# Patient Record
Sex: Female | Born: 1935 | Race: Black or African American | Hispanic: No | Smoking: Never smoker
Health system: Southern US, Community
[De-identification: ages and names within clinical notes are randomized; demographics above are authoritative.]

## PROBLEM LIST (undated history)

## (undated) DIAGNOSIS — I10 Essential (primary) hypertension: Secondary | ICD-10-CM

## (undated) HISTORY — PX: JOINT REPLACEMENT: SHX530

---

## 2020-10-05 ENCOUNTER — Emergency Department (HOSPITAL_BASED_OUTPATIENT_CLINIC_OR_DEPARTMENT_OTHER)
Admission: EM | Admit: 2020-10-05 | Discharge: 2020-10-05 | Disposition: A | Discharge status: Home / Self Care | Payer: Medicare Other | Attending: Emergency Medicine | Admitting: Emergency Medicine

## 2020-10-05 ENCOUNTER — Emergency Department (HOSPITAL_BASED_OUTPATIENT_CLINIC_OR_DEPARTMENT_OTHER): Payer: Medicare Other

## 2020-10-05 ENCOUNTER — Other Ambulatory Visit: Payer: Self-pay

## 2020-10-05 ENCOUNTER — Encounter (HOSPITAL_BASED_OUTPATIENT_CLINIC_OR_DEPARTMENT_OTHER): Payer: Self-pay | Admitting: Emergency Medicine

## 2020-10-05 DIAGNOSIS — S39012A Strain of muscle, fascia and tendon of lower back, initial encounter: Secondary | ICD-10-CM | POA: Insufficient documentation

## 2020-10-05 DIAGNOSIS — R519 Headache, unspecified: Secondary | ICD-10-CM | POA: Diagnosis not present

## 2020-10-05 DIAGNOSIS — Z79899 Other long term (current) drug therapy: Secondary | ICD-10-CM | POA: Diagnosis not present

## 2020-10-05 DIAGNOSIS — I10 Essential (primary) hypertension: Secondary | ICD-10-CM | POA: Diagnosis not present

## 2020-10-05 DIAGNOSIS — W101XXA Fall (on)(from) sidewalk curb, initial encounter: Secondary | ICD-10-CM | POA: Diagnosis not present

## 2020-10-05 DIAGNOSIS — S34109A Unspecified injury to unspecified level of lumbar spinal cord, initial encounter: Secondary | ICD-10-CM | POA: Diagnosis present

## 2020-10-05 DIAGNOSIS — M62830 Muscle spasm of back: Secondary | ICD-10-CM

## 2020-10-05 DIAGNOSIS — M545 Low back pain, unspecified: Secondary | ICD-10-CM

## 2020-10-05 HISTORY — DX: Essential (primary) hypertension: I10

## 2020-10-05 MED ORDER — LIDOCAINE 5 % EX PTCH: 1.0000 | MEDICATED_PATCH | CUTANEOUS | 0 refills | Status: AC

## 2020-10-05 MED ORDER — MORPHINE SULFATE (PF) 4 MG/ML IV SOLN
4.0000 mg | Freq: Once | INTRAVENOUS | Status: AC
  Administered 2020-10-05: 4 mg via INTRAVENOUS
  Filled 2020-10-05: qty 1

## 2020-10-05 MED ORDER — CYCLOBENZAPRINE HCL 5 MG PO TABS: 5.0000 mg | ORAL_TABLET | Freq: Two times a day (BID) | ORAL | 0 refills | Status: AC | PRN

## 2020-10-05 NOTE — ED Notes (Signed)
Pt fell getting into car. Complaining of lower back pain and pain in her tailbone.

## 2020-10-05 NOTE — ED Triage Notes (Signed)
Pt arrived via EMS after a fall complaining of tailbone and back pain. No deformities per EMS. Pt fell backwards getting out of car. Alert and oriented no loss of cons.

## 2020-10-05 NOTE — ED Notes (Signed)
Medicated per EDP orders, pt instructed not to get off stretcher without assistance in room, sr x 2 up, call bell within easy reach of client, cont to be on cont POX monitoring with int NBP assessments

## 2020-10-05 NOTE — ED Provider Notes (Signed)
[facility] EMERGENCY DEPARTMENT Provider Note   CSN: [id number] Arrival date & time: 10/05/20  1843     History Chief Complaint  Patient presents with  . Back Pain  . Rectal Pain    Alyssa Ellis is a 87 y.o. female.  The history is provided by the patient and medical records. No language interpreter was used.  Fall This is a new problem. The current episode started 3 to 5 hours ago. The problem occurs rarely. The problem has not changed since onset.Associated symptoms include headaches. Pertinent negatives include no chest pain, no abdominal pain and no shortness of breath. Nothing aggravates the symptoms. Nothing relieves the symptoms. She has tried nothing for the symptoms. The treatment provided no relief.       Past Medical History:  Diagnosis Date  . Hypertension     There are no problems to display for this patient.    OB History   No obstetric history on file.     No family history on file.  Social History   Tobacco Use  . Smoking status: Never Smoker  Vaping Use  . Vaping Use: Never used  Substance Use Topics  . Alcohol use: Never  . Drug use: Never    Home Medications Prior to Admission medications   Medication Sig Start Date End Date Taking? Authorizing Provider  DULoxetine (CYMBALTA) 30 MG capsule Take 30 mg by mouth daily.   Yes [provider]  metoprolol succinate (TOPROL-XL) 50 MG 24 hr tablet Take 50 mg by mouth daily. Take with or immediately following a meal.   Yes [provider]    Allergies    Eggs or egg-derived products  Review of Systems   Review of Systems  Constitutional: Negative for chills, fatigue and fever.  HENT: Negative for congestion.   Eyes: Negative for photophobia and visual disturbance.  Respiratory: Negative for cough, chest tightness and shortness of breath.   Cardiovascular: Negative for chest pain.  Gastrointestinal: Negative for abdominal pain, constipation, diarrhea, nausea and  vomiting.  Genitourinary: Negative for dysuria and frequency.  Musculoskeletal: Positive for back pain and neck pain. Negative for gait problem and neck stiffness.  Neurological: Positive for headaches. Negative for dizziness and weakness.  Psychiatric/Behavioral: Negative for agitation and confusion.  All other systems reviewed and are negative.   Physical Exam Updated Vital Signs BP (!) 178/67 (BP Location: Right Arm)   Pulse (!) 57   Temp 98.7 F (37.1 C) (Oral)   Resp 16   Ht 5\' 4"  (1.626 m)   Wt 80.7 kg   SpO2 98%   BMI 30.55 kg/m   Physical Exam Vitals and nursing note reviewed.  Constitutional:      General: She is not in acute distress.    Appearance: She is well-developed and well-nourished. She is not ill-appearing, toxic-appearing or diaphoretic.  HENT:     Head: Normocephalic and atraumatic.     Nose: No congestion or rhinorrhea.     Mouth/Throat:     Mouth: Mucous membranes are moist.     Pharynx: No oropharyngeal exudate or posterior oropharyngeal erythema.  Eyes:     Conjunctiva/sclera: Conjunctivae normal.     Pupils: Pupils are equal, round, and reactive to light.  Cardiovascular:     Rate and Rhythm: Normal rate and regular rhythm.     Heart sounds: No murmur heard.   Pulmonary:     Effort: Pulmonary effort is normal. No respiratory distress.     Breath sounds:  Normal breath sounds. No wheezing, rhonchi or rales.  Chest:     Chest wall: No tenderness.  Abdominal:     General: Abdomen is flat.     Palpations: Abdomen is soft.     Tenderness: There is no abdominal tenderness. There is no right CVA tenderness, left CVA tenderness, guarding or rebound.  Musculoskeletal:        General: Tenderness present. No edema.     Cervical back: Neck supple. No tenderness.     Lumbar back: Spasms and tenderness present.       Back:     Right lower leg: No edema.     Left lower leg: No edema.     Comments: No focal neurologic deficits.  Some tenderness in her  low back.  No laceration seen.  Normal sensation and strength and pulses in lower extremities.  Skin:    General: Skin is warm and dry.     Capillary Refill: Capillary refill takes less than 2 seconds.     Findings: No erythema.  Neurological:     Mental Status: She is alert.  Psychiatric:        Mood and Affect: Mood and affect and mood normal.     ED Results / Procedures / Treatments   Labs (all labs ordered are listed, but only abnormal results are displayed) Labs Reviewed - No data to display  EKG None  Radiology CT Head Wo Contrast  Result Date: 10/05/2020 CLINICAL DATA:  Head trauma EXAM: CT HEAD WITHOUT CONTRAST TECHNIQUE: Contiguous axial images were obtained from the base of the skull through the vertex without intravenous contrast. COMPARISON:  None. FINDINGS: Brain: No acute territorial infarction, hemorrhage or intracranial mass. Moderate atrophy. Mild hypodensity in the white matter consistent with chronic small vessel ischemic change. The ventricles are nonenlarged. Vascular: No hyperdense vessels.  Carotid vascular calcification Skull: Normal. Negative for fracture or focal lesion. Sinuses/Orbits: No acute finding. Other: None IMPRESSION: 1. No CT evidence for acute intracranial abnormality. 2. Atrophy and mild chronic small vessel ischemic changes of the white matter. Electronically Signed   By: Jasmine PangKim  Fujinaga M.D.   On: 10/05/2020 21:23   CT Cervical Spine Wo Contrast  Result Date: 10/05/2020 CLINICAL DATA:  Head trauma fall EXAM: CT CERVICAL SPINE WITHOUT CONTRAST TECHNIQUE: Multidetector CT imaging of the cervical spine was performed without intravenous contrast. Multiplanar CT image reconstructions were also generated. COMPARISON:  None. FINDINGS: Alignment: Straightening of the cervical spine. No subluxation. Facet alignment is maintained. Skull base and vertebrae: No acute fracture. No primary bone lesion or focal pathologic process. Soft tissues and spinal canal: No  prevertebral fluid or swelling. No visible canal hematoma. Disc levels: Multiple level degenerative change. Moderate disc space narrowing and degenerative change at C5-C6. Facet degenerative change at multiple levels Upper chest: Negative. Other: None IMPRESSION: Straightening of the cervical spine with degenerative changes. No acute osseous abnormality. Electronically Signed   By: Jasmine PangKim  Fujinaga M.D.   On: 10/05/2020 21:26   CT Lumbar Spine Wo Contrast  Result Date: 10/05/2020 CLINICAL DATA:  Fall EXAM: CT LUMBAR SPINE WITHOUT CONTRAST TECHNIQUE: Multidetector CT imaging of the lumbar spine was performed without intravenous contrast administration. Multiplanar CT image reconstructions were also generated. COMPARISON:  None. FINDINGS: Segmentation: 5 lumbar type vertebrae. Alignment: Preserved. Vertebrae: No acute fracture. Lumbar vertebral body heights are maintained apart from mild degenerative endplate irregularity. Paraspinal and other soft tissues: No paraspinal hematoma. Aortic atherosclerosis. Disc levels: Facet predominant degenerative changes are present. No  high-grade osseous encroachment on the spinal canal. IMPRESSION: No acute fracture. Electronically Signed   By: Guadlupe Spanish M.D.   On: 10/05/2020 21:19   DG Hips Bilat W or Wo Pelvis 5 Views  Result Date: 10/05/2020 CLINICAL DATA:  Larey Seat, sacrococcygeal pain EXAM: DG HIP (WITH OR WITHOUT PELVIS) 5+V BILAT COMPARISON:  None. FINDINGS: Frontal view of the pelvis as well as frontal and frogleg lateral views of both hips are obtained. No fracture, subluxation, or dislocation within either hip. Symmetrical bilateral hip osteoarthritis. Sacroiliac joints are normal. Diffuse atherosclerosis. Soft tissues are otherwise unremarkable. IMPRESSION: 1. Symmetrical bilateral hip osteoarthritis. No acute displaced fracture. Electronically Signed   By: Sharlet Salina M.D.   On: 10/05/2020 21:29    Procedures Procedures   Medications Ordered in  ED Medications  morphine 4 MG/ML injection 4 mg (4 mg Intravenous Given 10/05/20 2036)    ED Course  I have reviewed the triage vital signs and the nursing notes.  Pertinent labs & imaging results that were available during my care of the patient were reviewed by me and considered in my medical decision making (see chart for details).    MDM Rules/Calculators/A&P                          Casie Sturgeon is a 87 y.o. female with a past medical history significant for hypertension who presents with a mechanical fall.  She reports that she was getting out of a car today when she stepped on the curb wrong and fell down.  She was not using a walker or cane which she typically uses.  She reports pain in her low back, her bilateral hips and pelvis, and her head/neck.  She denies any pain in her mid back, chest, or abdomen.  Pain is moderate to severe.  She denies any loss of consciousness, double vision, blurry vision, nausea, vomiting, or any neurologic deficits.  On exam, lungs are clear and chest is nontender.  Abdomen is nontender.  She does have mild tenderness in both hips but can move her legs.  Normal sensation and strength in legs.  Good pulses in lower extremities.  She is some tenderness in her paraspinal lumbar spine as well as her occiput.  No laceration seen.  Exam otherwise unremarkable.  We together agreed to get a CT head and neck as well as CT of the lumbar spine and x-rays of the hips and pelvis.  All imaging showed no acute fracture dislocation.  Arthritis was seen extensively which we discussed.  Patient was feeling better on reassessment after pain medication.  Clinically I do feel patient is safe for discharge home with likely muscular symptoms.  We discussed getting any labs and we agreed to hold on this as this was mechanical mis-stepping and not anything preceding.  Patient will follow up with PCP and understood the caution using muscle relaxant and Lidoderm patches.  She had no  other questions or concerns and was discharged in good condition.   Final Clinical Impression(s) / ED Diagnoses Final diagnoses:  Acute bilateral low back pain without sciatica  Muscle spasm of back  Strain of lumbar region, initial encounter  Acute nonintractable headache, unspecified headache type  Fall involving sidewalk curb, initial encounter    Rx / DC Orders ED Discharge Orders         Ordered    cyclobenzaprine (FLEXERIL) 5 MG tablet  2 times daily PRN  10/05/20 2312    lidocaine (LIDODERM) 5 %  Every 24 hours        10/05/20 2312         Clinical Impression: 1. Acute bilateral low back pain without sciatica   2. Muscle spasm of back   3. Strain of lumbar region, initial encounter   4. Acute nonintractable headache, unspecified headache type   5. Fall involving sidewalk curb, initial encounter     Disposition: Discharge  Condition: Good  I have discussed the results, Dx and Tx plan with the pt(& family if present). He/she/they expressed understanding and agree(s) with the plan. Discharge instructions discussed at great length. Strict return precautions discussed and pt &/or family have verbalized understanding of the instructions. No further questions at time of discharge.    New Prescriptions   CYCLOBENZAPRINE (FLEXERIL) 5 MG TABLET    Take 1 tablet (5 mg total) by mouth 2 (two) times daily as needed for muscle spasms.   LIDOCAINE (LIDODERM) 5 %    Place 1 patch onto the skin daily. Remove & Discard patch within 12 hours or as directed by MD    Follow Up: Cornelius Moras, MD [address] [place] [address] [phone]     [facility] [id number] [place] [address] [phone]       Leeland Lovelady, Canary Brim, MD 10/05/20 [phone]

## 2020-10-05 NOTE — ED Notes (Signed)
HIGH FALL RISK interventions in place, sr x 2 up, fall risk bracelet placed on client, call bell with client as well. Daughter to side of bed

## 2020-10-05 NOTE — ED Notes (Signed)
States her back pain intensity has decreased, states she feels better.

## 2020-10-05 NOTE — Discharge Instructions (Signed)
Your history and exam today are consistent with musculoskeletal pains after the mechanical fall today.  After our discussion, we agreed to hold on any lab testing today and instead focus on the injuries.  Your imaging showed arthritis and chronic changes but no acute injuries or fractures.  I suspect your symptoms are more muscular and after our discussion, we agreed to give you the Lidoderm patches as well as the muscle relaxant which she will be careful taking to help with your symptoms.  Please follow-up with your primary doctor and be careful not to fall.  If any symptoms change or worsen, please return to the nearest emergency department.

## 2020-10-20 DEATH — deceased

## 2021-10-18 ENCOUNTER — Other Ambulatory Visit: Payer: Self-pay

## 2021-10-18 ENCOUNTER — Encounter (HOSPITAL_BASED_OUTPATIENT_CLINIC_OR_DEPARTMENT_OTHER): Payer: Self-pay | Admitting: Emergency Medicine

## 2021-10-18 ENCOUNTER — Emergency Department (HOSPITAL_BASED_OUTPATIENT_CLINIC_OR_DEPARTMENT_OTHER): Payer: Medicare Other

## 2021-10-18 ENCOUNTER — Emergency Department (HOSPITAL_BASED_OUTPATIENT_CLINIC_OR_DEPARTMENT_OTHER)
Admission: EM | Admit: 2021-10-18 | Discharge: 2021-10-18 | Disposition: A | Discharge status: Home / Self Care | Payer: Medicare Other | Attending: Emergency Medicine | Admitting: Emergency Medicine

## 2021-10-18 DIAGNOSIS — M79602 Pain in left arm: Secondary | ICD-10-CM | POA: Insufficient documentation

## 2021-10-18 DIAGNOSIS — R519 Headache, unspecified: Secondary | ICD-10-CM | POA: Diagnosis not present

## 2021-10-18 DIAGNOSIS — Z20822 Contact with and (suspected) exposure to covid-19: Secondary | ICD-10-CM | POA: Diagnosis not present

## 2021-10-18 DIAGNOSIS — W108XXA Fall (on) (from) other stairs and steps, initial encounter: Secondary | ICD-10-CM | POA: Insufficient documentation

## 2021-10-18 DIAGNOSIS — S5012XA Contusion of left forearm, initial encounter: Secondary | ICD-10-CM | POA: Diagnosis not present

## 2021-10-18 DIAGNOSIS — S59912A Unspecified injury of left forearm, initial encounter: Secondary | ICD-10-CM | POA: Diagnosis present

## 2021-10-18 DIAGNOSIS — R55 Syncope and collapse: Secondary | ICD-10-CM | POA: Insufficient documentation

## 2021-10-18 DIAGNOSIS — Z79899 Other long term (current) drug therapy: Secondary | ICD-10-CM | POA: Insufficient documentation

## 2021-10-18 LAB — URINALYSIS, ROUTINE W REFLEX MICROSCOPIC
Bilirubin Urine: NEGATIVE
Glucose, UA: NEGATIVE mg/dL
Hgb urine dipstick: NEGATIVE
Ketones, ur: NEGATIVE mg/dL
Nitrite: NEGATIVE
Protein, ur: NEGATIVE mg/dL
Specific Gravity, Urine: <1.005 — ABNORMAL LOW (ref 1.005–1.030)
pH: 5 (ref 5.0–8.0)

## 2021-10-18 LAB — CBC
HCT: 36.6 % (ref 36.0–46.0)
Hemoglobin: 12.1 g/dL (ref 12.0–15.0)
MCH: 30.6 pg (ref 26.0–34.0)
MCHC: 33.1 g/dL (ref 30.0–36.0)
MCV: 92.4 fL (ref 80.0–100.0)
Platelets: 250 10*3/uL (ref 150–400)
RBC: 3.96 MIL/uL (ref 3.87–5.11)
RDW: 13.1 % (ref 11.5–15.5)
WBC: 10.7 10*3/uL — ABNORMAL HIGH (ref 4.0–10.5)
nRBC: 0 % (ref 0.0–0.2)

## 2021-10-18 LAB — BASIC METABOLIC PANEL
Anion gap: 6 (ref 5–15)
BUN: 38 mg/dL — ABNORMAL HIGH (ref 8–23)
CO2: 26 mmol/L (ref 22–32)
Calcium: 9.2 mg/dL (ref 8.9–10.3)
Chloride: 100 mmol/L (ref 98–111)
Creatinine, Ser: 1.32 mg/dL — ABNORMAL HIGH (ref 0.44–1.00)
GFR, Estimated: 39 mL/min — ABNORMAL LOW (ref >60)
Glucose, Bld: 103 mg/dL — ABNORMAL HIGH (ref 70–99)
Potassium: 4.2 mmol/L (ref 3.5–5.1)
Sodium: 132 mmol/L — ABNORMAL LOW (ref 135–145)

## 2021-10-18 LAB — URINALYSIS, MICROSCOPIC (REFLEX)

## 2021-10-18 LAB — RESP PANEL BY RT-PCR (FLU A&B, COVID) ARPGX2
Influenza A by PCR: NEGATIVE
Influenza B by PCR: NEGATIVE
SARS Coronavirus 2 by RT PCR: NEGATIVE

## 2021-10-18 MED ORDER — LACTATED RINGERS IV BOLUS: 1000.0000 mL | Freq: Once | INTRAVENOUS | Status: DC

## 2021-10-18 NOTE — Discharge Instructions (Addendum)
Follow-up with your primary care doctor and your cardiologist.  Return to ER if you have any additional episodes of passing out, new falls or other new concerning symptom.  Recommend drinking plenty of fluids throughout the day on a regular basis.  Recommend eating regular meals spaced throughout the day.

## 2021-10-18 NOTE — ED Triage Notes (Signed)
Pt had syncopal episode today at 1330.  Pt has been having frequent syncopal events and falls at home since last February.  Pt state she was on the stair lift and started to go up, when things got fuzzy/blurry and she passed out.  Pt c/o headache which she has had since November.  Daughter states she has been weak and dizzy a lot and is concerned about an area on her skull being "soft".

## 2021-10-18 NOTE — ED Provider Notes (Signed)
[place] EMERGENCY DEPARTMENT Provider Note   CSN: [id number] Arrival date & time: 10/18/21  1729     History  Chief Complaint  Patient presents with   Loss of Consciousness    Alyssa Ellis is a 88 y.o. female.  Presenting to the emergency department with concern for episode of syncope, fall.  Episode occurred around 130 this afternoon.  She was getting on her stair lift when she felt lightheaded and passed out, fell forward, thinks that she hit her head.  No prolonged LOC.  No bladder or bowel incontinence.  Witnessed by family member.  No shaking.  Has had prior syncopal episodes and has been evaluated by cardiology and primary care doctor.  Is having pain to her head, mild.  Also having some pain to her left arm.  Per review of chart, seen by [facility] cardiology, underwent Holter monitor, echocardiogram.  Echo in 2022 was grossly normal.  HPI     Home Medications Prior to Admission medications   Medication Sig Start Date End Date Taking? Authorizing Provider  cyclobenzaprine (FLEXERIL) 5 MG tablet Take 1 tablet (5 mg total) by mouth 2 (two) times daily as needed for muscle spasms. 10/05/20   Tegeler, Gwenyth Allegra, MD  DULoxetine (CYMBALTA) 30 MG capsule Take 30 mg by mouth daily.    [provider]  lidocaine (LIDODERM) 5 % Place 1 patch onto the skin daily. Remove & Discard patch within 12 hours or as directed by MD 10/05/20   Tegeler, Gwenyth Allegra, MD  metoprolol succinate (TOPROL-XL) 50 MG 24 hr tablet Take 50 mg by mouth daily. Take with or immediately following a meal.    [provider]      Allergies    Eggs or egg-derived products    Review of Systems   Review of Systems  Constitutional:  Negative for chills and fever.  HENT:  Negative for ear pain and sore throat.   Eyes:  Negative for pain and visual disturbance.  Respiratory:  Negative for cough and shortness of breath.   Cardiovascular:  Negative for chest pain and palpitations.   Gastrointestinal:  Negative for abdominal pain and vomiting.  Genitourinary:  Negative for dysuria and hematuria.  Musculoskeletal:  Positive for arthralgias. Negative for back pain.  Skin:  Negative for color change and rash.  Neurological:  Positive for syncope. Negative for seizures.  All other systems reviewed and are negative.  Physical Exam Updated Vital Signs BP (!) 160/70    Pulse 70    Temp 99.2 F (37.3 C) (Oral)    Resp 19    Ht 5\' 4"  (1.626 m)    Wt 79.8 kg    SpO2 100%    BMI 30.21 kg/m  Physical Exam Vitals and nursing note reviewed.  Constitutional:      General: She is not in acute distress.    Appearance: She is well-developed.  HENT:     Head: Normocephalic and atraumatic.  Eyes:     Conjunctiva/sclera: Conjunctivae normal.  Cardiovascular:     Rate and Rhythm: Normal rate and regular rhythm.     Heart sounds: No murmur heard. Pulmonary:     Effort: Pulmonary effort is normal. No respiratory distress.     Breath sounds: Normal breath sounds.  Abdominal:     Palpations: Abdomen is soft.     Tenderness: There is no abdominal tenderness.  Musculoskeletal:        General: No swelling.     Cervical back: Neck  supple.     Comments: Back: no C, T, L spine TTP, no step off or deformity RUE: no TTP throughout, no deformity, normal joint ROM, radial pulse intact, distal sensation and motor intact LUE: Mild tenderness to palpation to the left forearm, there is small area of ecchymosis to the left forearm, normal joint ROM, radial pulse intact, distal sensation and motor intact RLE:  no TTP throughout, no deformity, normal joint ROM, distal pulse, sensation and motor intact LLE: no TTP throughout, no deformity, normal joint ROM, distal pulse, sensation and motor intact  Skin:    General: Skin is warm and dry.     Capillary Refill: Capillary refill takes less than 2 seconds.  Neurological:     Mental Status: She is alert.  Psychiatric:        Mood and Affect: Mood  normal.    ED Results / Procedures / Treatments   Labs (all labs ordered are listed, but only abnormal results are displayed) Labs Reviewed  BASIC METABOLIC PANEL - Abnormal; Notable for the following components:      Result Value   Sodium 132 (*)    Glucose, Bld 103 (*)    BUN 38 (*)    Creatinine, Ser 1.32 (*)    GFR, Estimated 39 (*)    All other components within normal limits  CBC - Abnormal; Notable for the following components:   WBC 10.7 (*)    All other components within normal limits  URINALYSIS, ROUTINE W REFLEX MICROSCOPIC - Abnormal; Notable for the following components:   Color, Urine STRAW (*)    Specific Gravity, Urine <1.005 (*)    Leukocytes,Ua SMALL (*)    All other components within normal limits  URINALYSIS, MICROSCOPIC (REFLEX) - Abnormal; Notable for the following components:   Bacteria, UA FEW (*)    All other components within normal limits  RESP PANEL BY RT-PCR (FLU A&B, COVID) [id number]    EKG EKG Interpretation  Date/Time:  Monday October 18 2021 18:08:31 EST Ventricular Rate:  72 PR Interval:  175 QRS Duration: 72 QT Interval:  394 QTC Calculation: 432 R Axis:   17 Text Interpretation: Sinus rhythm Probable anterior infarct, old Confirmed by Madalyn Rob [phone]) on 10/18/2021 6:09:59 PM  Radiology DG Chest 2 View  Result Date: 10/18/2021 CLINICAL DATA:  Syncope EXAM: CHEST - 2 VIEW COMPARISON:  None. FINDINGS: Heart and mediastinal contours are within normal limits. No focal opacities or effusions. No acute bony abnormality. IMPRESSION: No active cardiopulmonary disease. Electronically Signed   By: Rolm Baptise M.D.   On: 10/18/2021 18:25   DG Forearm Left  Result Date: 10/18/2021 CLINICAL DATA:  Left arm pain, fall EXAM: LEFT FOREARM - 2 VIEW COMPARISON:  None. FINDINGS: There is no evidence of fracture or other focal bone lesions. Soft tissues are unremarkable. IMPRESSION: Negative. Electronically Signed   By: Rolm Baptise M.D.   On:  10/18/2021 19:30   CT HEAD WO CONTRAST  Result Date: 10/18/2021 CLINICAL DATA:  Head trauma, minor (Age >= 65y).  Syncope EXAM: CT HEAD WITHOUT CONTRAST TECHNIQUE: Contiguous axial images were obtained from the base of the skull through the vertex without intravenous contrast. RADIATION DOSE REDUCTION: This exam was performed according to the departmental dose-optimization program which includes automated exposure control, adjustment of the mA and/or kV according to patient size and/or use of iterative reconstruction technique. COMPARISON:  10/05/2020 FINDINGS: Brain: No acute intracranial abnormality. Specifically, no hemorrhage, hydrocephalus, mass lesion, acute infarction, or significant intracranial  injury. Vascular: No hyperdense vessel or unexpected calcification. Skull: No acute calvarial abnormality. Sinuses/Orbits: No acute findings Other: None IMPRESSION: No acute intracranial abnormality. Electronically Signed   By: Rolm Baptise M.D.   On: 10/18/2021 18:25   CT Cervical Spine Wo Contrast  Result Date: 10/18/2021 CLINICAL DATA:  Neck trauma, syncope EXAM: CT CERVICAL SPINE WITHOUT CONTRAST TECHNIQUE: Multidetector CT imaging of the cervical spine was performed without intravenous contrast. Multiplanar CT image reconstructions were also generated. RADIATION DOSE REDUCTION: This exam was performed according to the departmental dose-optimization program which includes automated exposure control, adjustment of the mA and/or kV according to patient size and/or use of iterative reconstruction technique. COMPARISON:  10/05/2020 FINDINGS: Alignment: Straightening of the cervical spine unchanged. Otherwise alignment is anatomic. Skull base and vertebrae: No acute fracture. No primary bone lesion or focal pathologic process. Soft tissues and spinal canal: No prevertebral fluid or swelling. No visible canal hematoma. Disc levels: Stable mild cervical spondylosis greatest at C5-6. Diffuse facet hypertrophy  greatest from C2 through C5. Upper chest: Airway is patent.  Lung apices are clear. Other: Reconstructed images demonstrate no additional findings. IMPRESSION: 1. No acute cervical spine fracture. 2. Stable multilevel spondylosis and facet hypertrophy. Electronically Signed   By: Randa Ngo M.D.   On: 10/18/2021 19:36    Procedures Procedures    Medications Ordered in ED Medications - No data to display   ED Course/ Medical Decision Making/ A&P                           Medical Decision Making Amount and/or Complexity of Data Reviewed Labs: ordered. Radiology: ordered.   88 year old lady presenting to ER after syncopal episode, concern for head trauma.  On physical exam she currently appears well and denies ongoing symptoms except for some mild head pain and arm pain.  On physical exam do not appreciate significant traumatic finding on head.  Mild ecchymosis to left arm.  CT head, C-spine is negative.  Plain films of chest, forearm also negative.  I independently reviewed both CT and XR imaging.  Basic labs are stable, no anemia or electrolyte derangement.  Completed extensive chart review, review of past PCP, cardiology notes.  Has had a couple other episodes of syncope over the past year or so.  Has undergone outpatient work-up including echocardiogram and Holter monitor that were reportedly normal.  Given patient has no ongoing symptoms now has no and no further episodes today, feel she is stable for discharge and follow-up with her regular doctor and cardiologist.  Her daughter was at bedside and provided some additional history, corroborated events.  Updated throughout stay.        Final Clinical Impression(s) / ED Diagnoses Final diagnoses:  Syncope, unspecified syncope type    Rx / DC Orders ED Discharge Orders     None         Lucrezia Starch, MD 10/18/21 2137

## 2023-10-09 IMAGING — DX DG FOREARM 2V*L*
2 series · 2 of 2 positions shown · non-contrast
Comparison: None.

CLINICAL DATA: Left arm pain, fall

EXAM:
LEFT FOREARM - 2 VIEW

[forearm ap]
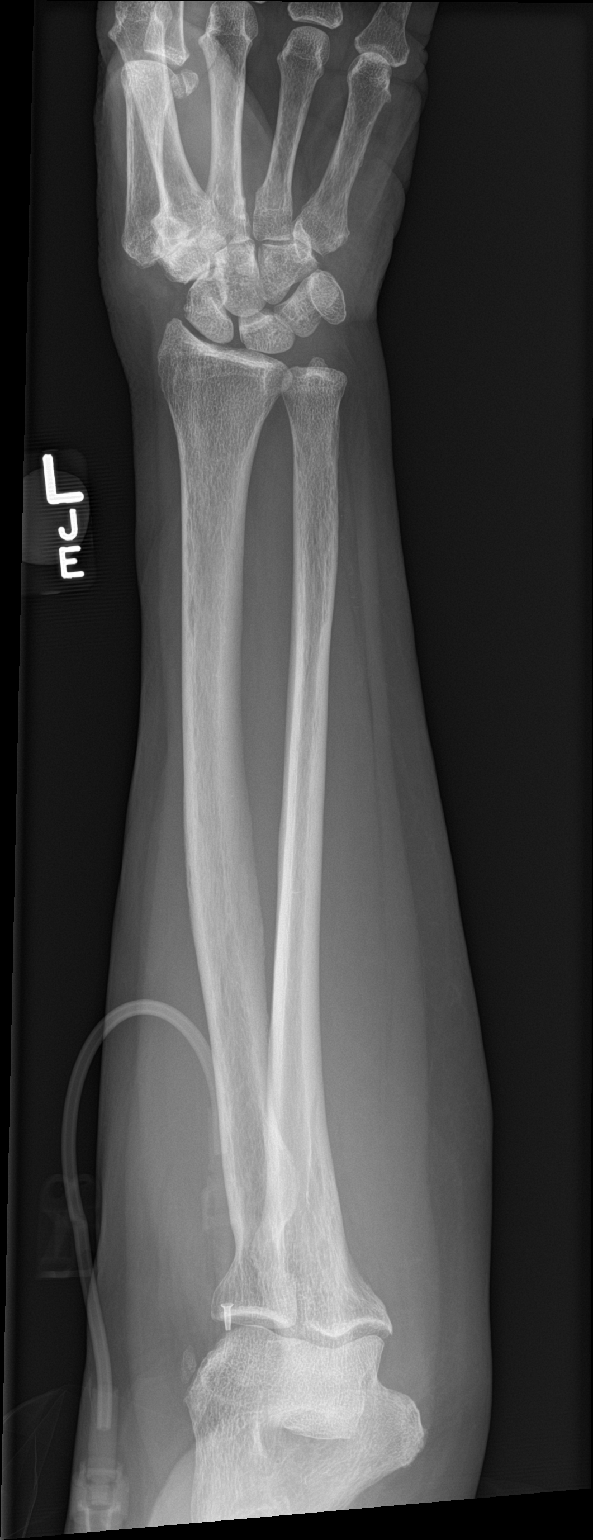

[forearm lat]
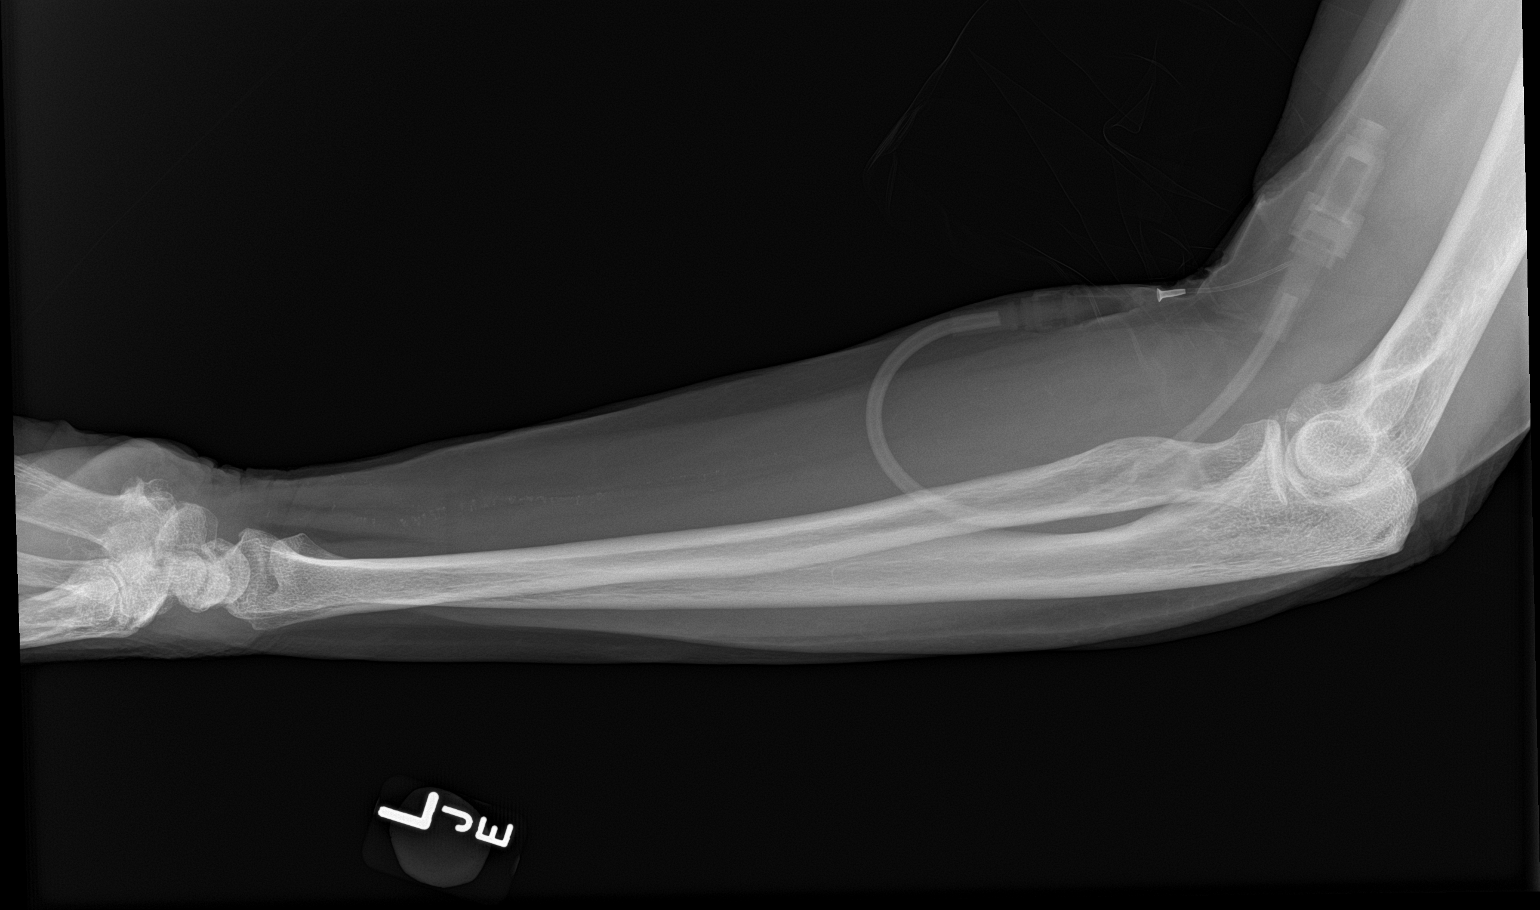

[2 of 2 positions shown; findings below may reference images not displayed]

FINDINGS: There is no evidence of fracture or other focal bone lesions. Soft
tissues are unremarkable.
IMPRESSION: Negative.

## 2023-10-09 IMAGING — CT CT CERVICAL SPINE W/O CM
4 series · 14 of 33 positions shown, 17 images · non-contrast
Comparison: 10/05/2020

CLINICAL DATA: Neck trauma, syncope



[Series 3: c spine soft · axial · 0.48mm/px · 1 of 81 slices shown]
[im 14/81  soft-tissue]
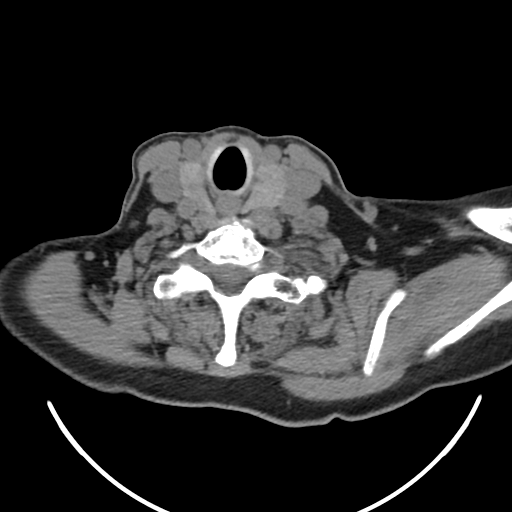

[Series 5: sagittal bone · sagittal · 0.31mm/px · 5 of 61 slices shown, 6 images]
[im 21/61  bone]
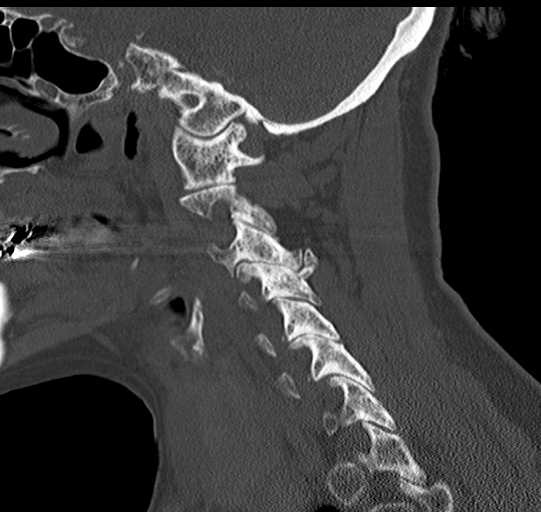
[im 26/61  bone]
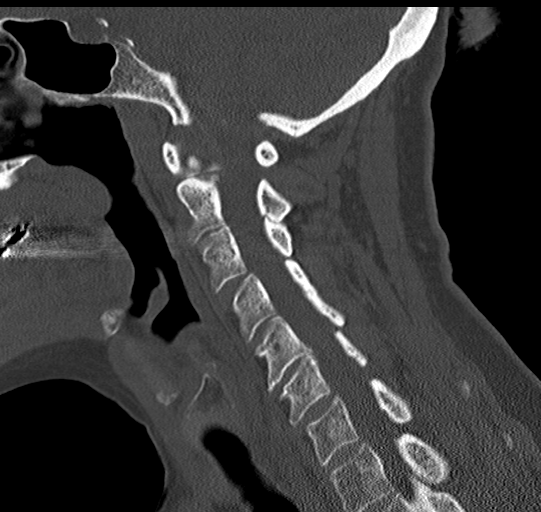
[im 31/61  soft-tissue]
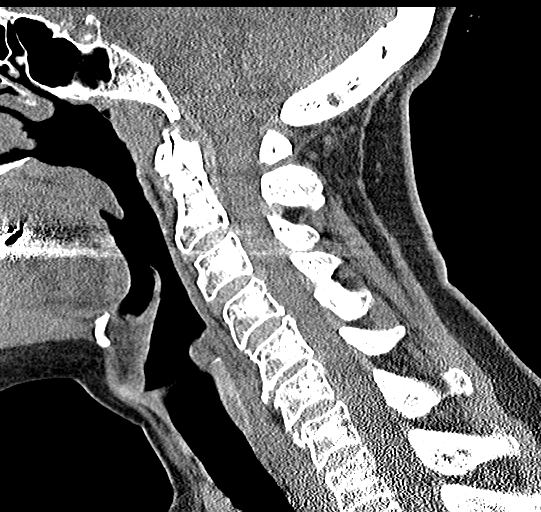
[im 31/61  bone]
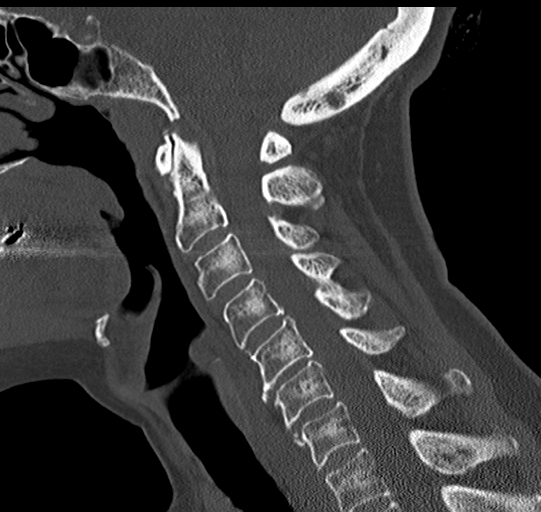
[im 36/61  bone]
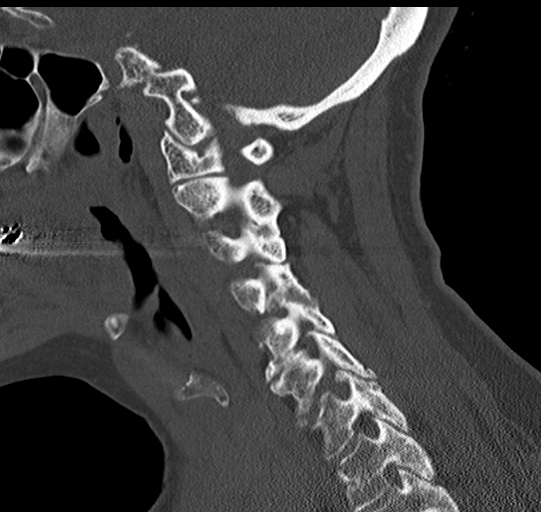
[im 41/61  bone]
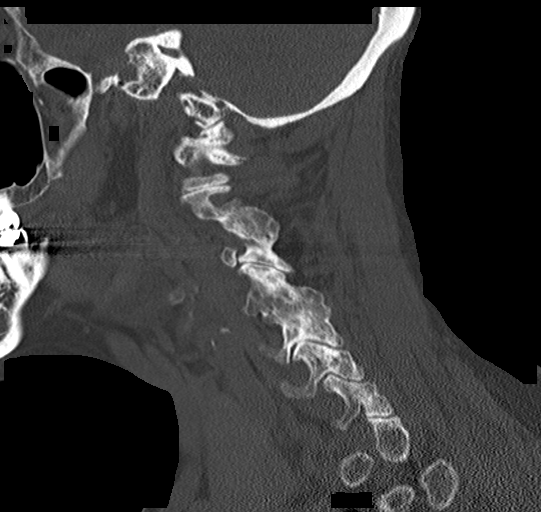

[Series 6: coronal bone · coronal · 0.24mm/px · 3 of 61 slices shown]
[im 13/61  bone]
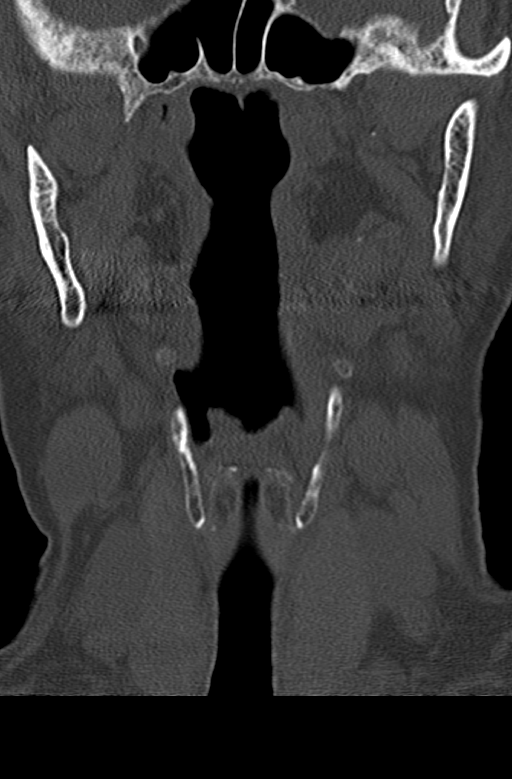
[im 25/61  bone]
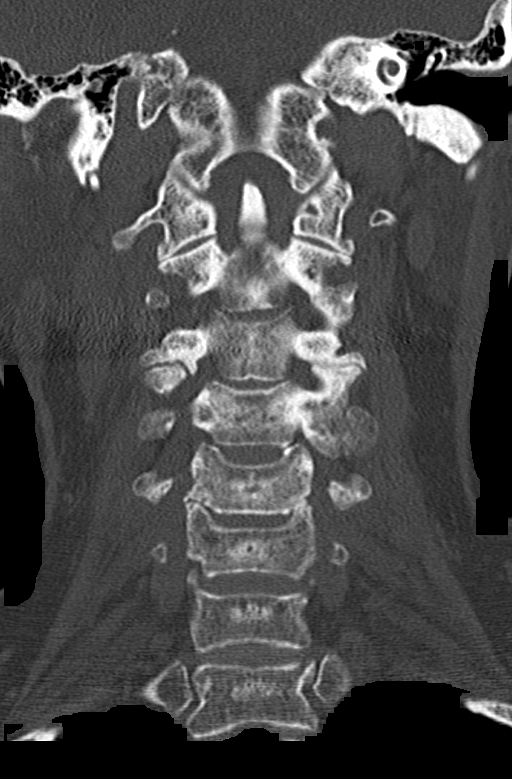
[im 37/61  bone]
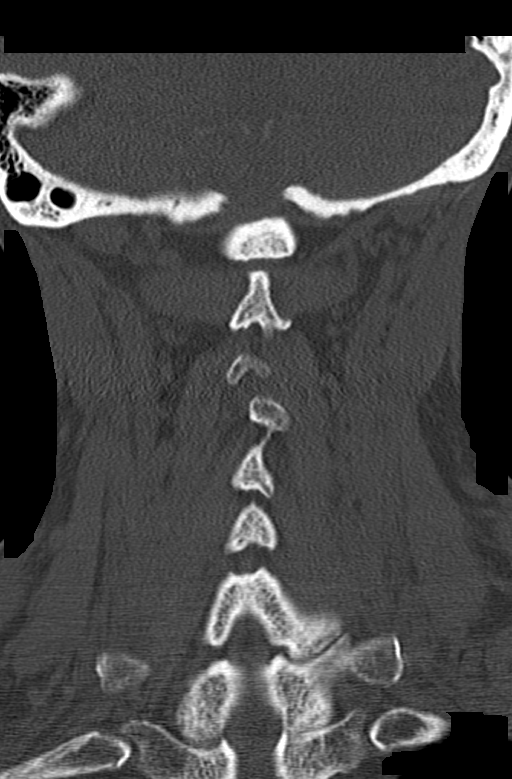

[Series 7: orthogonal bone · axial · 0.23mm/px · z∈[+1304,+1405]mm · 5 of 87 slices shown, 7 images]
[im 15/87  soft-tissue]
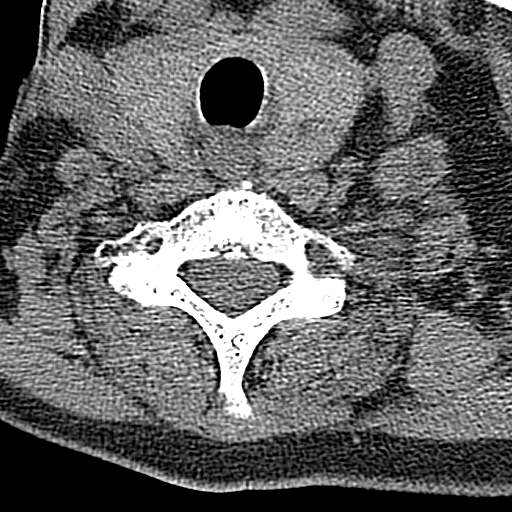
[im 15/87  bone]
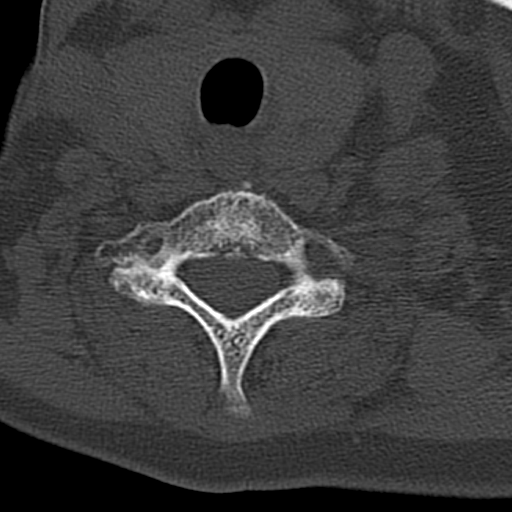
[im 29/87  bone]
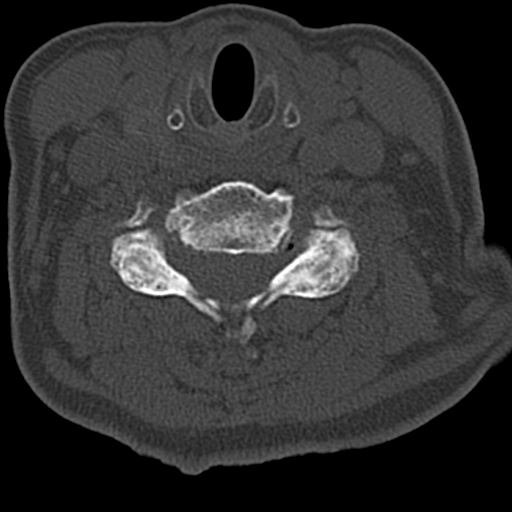
[im 44/87  bone]
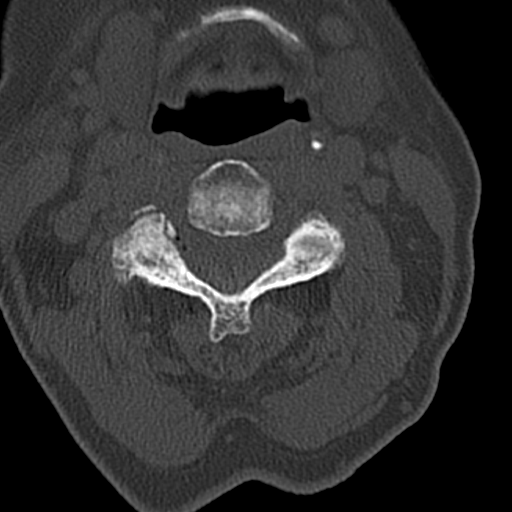
[im 58/87  bone]
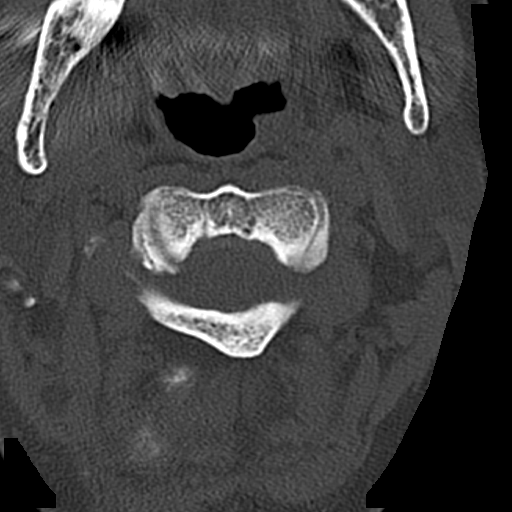
[im 72/87  soft-tissue]
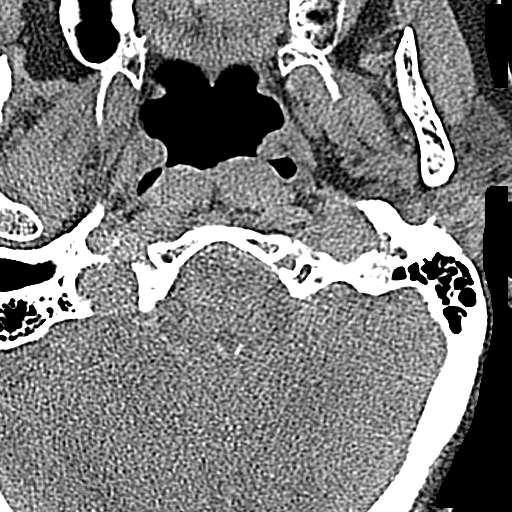
[im 72/87  bone]
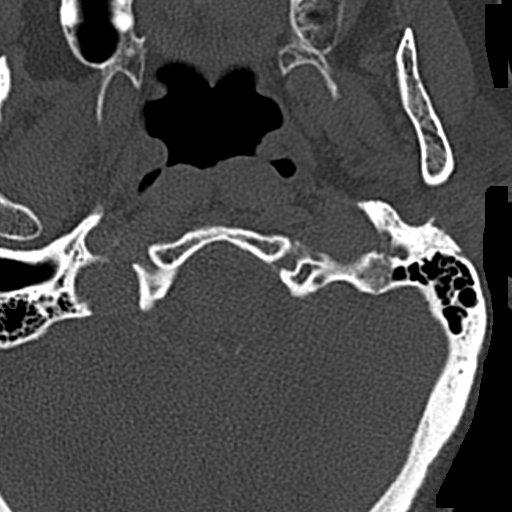

[14 of 33 positions shown; findings below may reference images not displayed]

FINDINGS: Alignment: Straightening of the cervical spine unchanged. Otherwise
alignment is anatomic.

Skull base and vertebrae: No acute fracture. No primary bone lesion
or focal pathologic process.

Soft tissues and spinal canal: No prevertebral fluid or swelling. No
visible canal hematoma.

Disc levels: Stable mild cervical spondylosis greatest at C5-6.
Diffuse facet hypertrophy greatest from C2 through C5.

Upper chest: Airway is patent.  Lung apices are clear.

Other: Reconstructed images demonstrate no additional findings.
IMPRESSION: 1. No acute cervical spine fracture.
2. Stable multilevel spondylosis and facet hypertrophy.
